# Patient Record
Sex: Female | Born: 1985 | Race: Black or African American | Hispanic: No | Marital: Single | Smoking: Never smoker
Health system: Southern US, Community
[De-identification: ages and names within clinical notes are randomized; demographics above are authoritative.]

---

## 2014-03-24 ENCOUNTER — Emergency Department (HOSPITAL_COMMUNITY): Payer: Self-pay

## 2014-03-24 ENCOUNTER — Emergency Department (HOSPITAL_COMMUNITY)
Admission: EM | Admit: 2014-03-24 | Discharge: 2014-03-24 | Disposition: A | Discharge status: Home / Self Care | Payer: Self-pay | Attending: Emergency Medicine | Admitting: Emergency Medicine

## 2014-03-24 ENCOUNTER — Encounter (HOSPITAL_COMMUNITY): Payer: Self-pay | Admitting: Emergency Medicine

## 2014-03-24 DIAGNOSIS — J069 Acute upper respiratory infection, unspecified: Secondary | ICD-10-CM | POA: Insufficient documentation

## 2014-03-24 MED ORDER — ALBUTEROL SULFATE (2.5 MG/3ML) 0.083% IN NEBU
5.0000 mg | INHALATION_SOLUTION | Freq: Once | RESPIRATORY_TRACT | Status: AC
  Administered 2014-03-24: 5 mg via RESPIRATORY_TRACT
  Filled 2014-03-24: qty 6

## 2014-03-24 MED ORDER — ALBUTEROL SULFATE HFA 108 (90 BASE) MCG/ACT IN AERS
2.0000 | INHALATION_SPRAY | RESPIRATORY_TRACT | Status: DC | PRN
  Filled 2014-03-24: qty 6.7

## 2014-03-24 MED ORDER — SALINE SPRAY 0.65 % NA SOLN: 1.0000 | NASAL | Status: AC | PRN

## 2014-03-24 MED ORDER — GUAIFENESIN 100 MG/5ML PO LIQD: 100.0000 mg | ORAL | Status: AC | PRN

## 2014-03-24 NOTE — ED Provider Notes (Signed)
CSN: [id number]     Arrival date & time 03/24/14  8119 History   First MD Initiated Contact with Patient 03/24/14 1000     Chief Complaint  Patient presents with  . Shortness of Breath     (Consider location/radiation/quality/duration/timing/severity/associated sxs/prior Treatment) HPI  Pt is a 29yo female presenting to ED with intermittent, gradually worsening SOB over the last 5 days, yesterday she developed a wheeze.  Pt states she started having a sore throat with a cough last week, started taking Theraflu but states that is when the wheezing seemed to start. She denies any rash. Denies difficulty swallowing or throat swelling. No known medication allergies.  States she does not have a hx of asthma but did have wheezing previous and was dx with a URI.  She does not have an inhaler at home. Denies smoking. Denies n/v/d. No sick contacts or recent travel. Denies leg pain or swelling. No hx of DVT or PE.   History reviewed. No pertinent past medical history. History reviewed. No pertinent past surgical history. No family history on file. History  Substance Use Topics  . Smoking status: Not on file  . Smokeless tobacco: Not on file  . Alcohol Use: Not on file   OB History    No data available     Review of Systems  Constitutional: Positive for chills. Negative for fever, appetite change and fatigue.  HENT: Positive for congestion and sore throat. Negative for ear pain.   Respiratory: Positive for cough, shortness of breath and wheezing. Negative for stridor.   Cardiovascular: Negative for chest pain, palpitations and leg swelling.  Gastrointestinal: Negative for nausea, vomiting, abdominal pain and diarrhea.  Musculoskeletal: Positive for myalgias. Negative for arthralgias.  All other systems reviewed and are negative.     Allergies  Shrimp  Home Medications   Prior to Admission medications   Medication Sig Start Date End Date Taking? Authorizing Provider  guaiFENesin  (ROBITUSSIN) 100 MG/5ML liquid Take 5-10 mLs (100-200 mg total) by mouth every 4 (four) hours as needed for cough. 03/24/14   Junius Finner, PA-C  sodium chloride (OCEAN) 0.65 % SOLN nasal spray Place 1 spray into both nostrils as needed for congestion. 03/24/14   Junius Finner, PA-C   BP 106/69 mmHg  Pulse 79  Temp(Src) 97.9 F (36.6 C) (Oral)  Resp 20  SpO2 100%  LMP 02/07/2014 Physical Exam  Constitutional: She appears well-developed and well-nourished. No distress.  HENT:  Head: Normocephalic and atraumatic.  Right Ear: Hearing, tympanic membrane, external ear and ear canal normal.  Left Ear: Hearing, tympanic membrane, external ear and ear canal normal.  Nose: Mucosal edema present. Right sinus exhibits no maxillary sinus tenderness and no frontal sinus tenderness. Left sinus exhibits no maxillary sinus tenderness and no frontal sinus tenderness.  Mouth/Throat: Uvula is midline and mucous membranes are normal. Posterior oropharyngeal erythema present. No oropharyngeal exudate, posterior oropharyngeal edema or tonsillar abscesses.  Eyes: Conjunctivae are normal. No scleral icterus.  Neck: Normal range of motion.  Cardiovascular: Normal rate, regular rhythm and normal heart sounds.   Pulmonary/Chest: Effort normal and breath sounds normal. No respiratory distress. She has no wheezes. She has no rales. She exhibits no tenderness.  Exam after pt received albuterol nebulizer tx in triage. Pt speaking in full sentences, no respiratory distress. Lungs: CTAB  Abdominal: Soft. Bowel sounds are normal. She exhibits no distension and no mass. There is no tenderness. There is no rebound and no guarding.  Musculoskeletal: Normal range of  motion.  Neurological: She is alert.  Skin: Skin is warm and dry. She is not diaphoretic.  Nursing note and vitals reviewed.   ED Course  Procedures (including critical care time) Labs Review Labs Reviewed - No data to display  Imaging Review Dg Chest 2  View (if Patient Has Fever And/or Copd)  03/24/2014   CLINICAL DATA:  Short of breath  EXAM: CHEST  2 VIEW  COMPARISON:  None.  FINDINGS: The heart size and mediastinal contours are within normal limits. Both lungs are clear. The visualized skeletal structures are unremarkable.  IMPRESSION: No active cardiopulmonary disease.   Electronically Signed   By: Marlan Palauharles  Clark M.D.   On: 03/24/2014 07:23     EKG Interpretation None      MDM   Final diagnoses:  URI (upper respiratory infection)    Pt is a 28yo female presenting to ED with c/o wheeze, cough and congestion for 4 days. CXR: negative for acute cardiopulmonary disease. Pt afebrile, O2 Sat 100% on RA.    Lungs: CTAB on exam after pt received neb tx in ED. No previous hx of asthma.   Pt stable for discharge home as she states she feels much better after neb tx and feels comfortable going home. Advised to f/u with PCP for recheck of symptoms if not improving. Return precautions provided. Pt verbalized understanding and agreement with tx plan.     Junius Finnerrin O'Malley, PA-C 03/24/14 1700  Tilden FossaElizabeth Rees, MD 03/24/14 1734

## 2014-03-24 NOTE — ED Notes (Signed)
NAD at this time. Pt is stable and leaving with a friend. 

## 2014-03-24 NOTE — Discharge Instructions (Signed)
Cool Mist Vaporizers °Vaporizers may help relieve the symptoms of a cough and cold. They add moisture to the air, which helps mucus to become thinner and less sticky. This makes it easier to breathe and cough up secretions. Cool mist vaporizers do not cause serious burns like hot mist vaporizers, which may also be called steamers or humidifiers. Vaporizers have not been proven to help with colds. You should not use a vaporizer if you are allergic to mold. °HOME CARE INSTRUCTIONS °· Follow the package instructions for the vaporizer. °· Do not use anything other than distilled water in the vaporizer. °· Do not run the vaporizer all of the time. This can cause mold or bacteria to grow in the vaporizer. °· Clean the vaporizer after each time it is used. °· Clean and dry the vaporizer well before storing it. °· Stop using the vaporizer if worsening respiratory symptoms develop. °Document Released: 09/23/2003 Document Revised: 12/31/2012 Document Reviewed: 05/15/2012 °ExitCare® Patient Information ©2015 [employer]. This information is not intended to replace advice given to you by your health care provider. Make sure you discuss any questions you have with your health care provider. ° °

## 2014-03-24 NOTE — ED Notes (Signed)
Pt report she has been having progressively worsening SOB since last Thursday. Reports yesterday she started wheezing. Patient able to speak in complete sentences in triage, SPO2 100%, Ax4, NAD.

## 2014-10-04 ENCOUNTER — Emergency Department (HOSPITAL_COMMUNITY)
Admission: EM | Admit: 2014-10-04 | Discharge: 2014-10-04 | Disposition: A | Discharge status: Home / Self Care | Payer: Managed Care, Other (non HMO) | Attending: Emergency Medicine | Admitting: Emergency Medicine

## 2014-10-04 ENCOUNTER — Encounter (HOSPITAL_COMMUNITY): Payer: Self-pay | Admitting: Nurse Practitioner

## 2014-10-04 ENCOUNTER — Emergency Department (HOSPITAL_COMMUNITY): Payer: Managed Care, Other (non HMO)

## 2014-10-04 DIAGNOSIS — Y998 Other external cause status: Secondary | ICD-10-CM | POA: Insufficient documentation

## 2014-10-04 DIAGNOSIS — S6991XA Unspecified injury of right wrist, hand and finger(s), initial encounter: Secondary | ICD-10-CM | POA: Diagnosis present

## 2014-10-04 DIAGNOSIS — X58XXXA Exposure to other specified factors, initial encounter: Secondary | ICD-10-CM | POA: Insufficient documentation

## 2014-10-04 DIAGNOSIS — Y9383 Activity, rough housing and horseplay: Secondary | ICD-10-CM | POA: Insufficient documentation

## 2014-10-04 DIAGNOSIS — S63501A Unspecified sprain of right wrist, initial encounter: Secondary | ICD-10-CM | POA: Diagnosis not present

## 2014-10-04 DIAGNOSIS — Y9289 Other specified places as the place of occurrence of the external cause: Secondary | ICD-10-CM | POA: Insufficient documentation

## 2014-10-04 MED ORDER — KETOROLAC TROMETHAMINE 60 MG/2ML IM SOLN
60.0000 mg | Freq: Once | INTRAMUSCULAR | Status: AC
  Administered 2014-10-04: 60 mg via INTRAMUSCULAR
  Filled 2014-10-04: qty 2

## 2014-10-04 MED ORDER — NAPROXEN 500 MG PO TABS: 500.0000 mg | ORAL_TABLET | Freq: Two times a day (BID) | ORAL | Status: AC

## 2014-10-04 NOTE — ED Notes (Signed)
She c/o R wrist pain since she was horseplaying with a friend on Friday night. Cms intact. She tried advil at home with minimal relief

## 2014-10-04 NOTE — Discharge Instructions (Signed)
Wrist Pain Follow up with [facility]. Return for numbness or tingling in the hand. Wear wrist splint until you no longer have pain. A wrist sprain happens when the bands of tissue that hold the wrist joints together (ligament) stretch too much or tear. A wrist strain happens when muscles or bands of tissue that connect muscles to bones (tendons) are stretched or pulled. HOME CARE  Put ice on the injured area.  Put ice in a plastic bag.  Place a towel between your skin and the bag.  Leave the ice on for 15-20 minutes, 03-04 times a day, for the first 2 days.  Raise (elevate) the injured wrist to lessen puffiness (swelling).  Rest the injured wrist for at least 48 hours or as told by your doctor.  Wear a splint, cast, or an elastic wrap as told by your doctor.  Only take medicine as told by your doctor.  Follow up with your doctor as told. This is important. GET HELP RIGHT AWAY IF:   The fingers are puffy, very red, white, or cold and blue.  The fingers lose feeling (numb) or tingle.  The pain gets worse.  It is hard to move the fingers. MAKE SURE YOU:   Understand these instructions.  Will watch your condition.  Will get help right away if you are not doing well or get worse. Document Released: 06/14/2007 Document Revised: 03/20/2011 Document Reviewed: 02/16/2010 [facility] Patient Information 2015 [place], [place]. This information is not intended to replace advice given to you by your health care provider. Make sure you discuss any questions you have with your health care provider.  Emergency Department Resource Guide 1) Find a Doctor and Pay Out of Pocket Although you won't have to find out who is covered by your insurance plan, it is a good idea to ask around and get recommendations. You will then need to call the office and see if the doctor you have chosen will accept you as a new patient and what types of options they offer for patients who are self-pay.  Some doctors offer discounts or will set up payment plans for their patients who do not have insurance, but you will need to ask so you aren't surprised when you get to your appointment.  2) Contact Your Local Health Department Not all health departments have doctors that can see patients for sick visits, but many do, so it is worth a call to see if yours does. If you don't know where your local health department is, you can check in your phone book. The CDC also has a tool to help you locate your state's health department, and many state websites also have listings of all of their local health departments.  3) Find a Walk-in Clinic If your illness is not likely to be very severe or complicated, you may want to try a walk in clinic. These are popping up all over the country in pharmacies, drugstores, and shopping centers. They're usually staffed by nurse practitioners or physician assistants that have been trained to treat common illnesses and complaints. They're usually fairly quick and inexpensive. However, if you have serious medical issues or chronic medical problems, these are probably not your best option.  No Primary Care Doctor: - Call Health Connect at  [phone] - they can help you locate a primary care doctor that  accepts your insurance, provides certain services, etc. - Physician Referral Service- [phone]  Chronic Pain Problems: Organization  Address  Phone   Notes  [facility]  [phone] Patients need to be referred by their primary care doctor.   Medication Assistance: Organization         Address  Phone   Notes  [facility] Medication [facility] [address] [place], [place] [address] [phone] --Must be a resident of [facility] -- Must have NO insurance coverage whatsoever (no Medicaid/ Medicare, etc.) -- The pt. MUST have a primary care doctor that directs their care regularly and follows them in the  community   MedAssist  [phone]   [employer]  [phone]    Agencies that provide inexpensive medical care: Organization         Address  Phone   Notes  [place]  [phone]   [facility]    [phone]   [facility] [place], [place] [address] [phone]   [place] [address], [place] [phone]   Planned Parenthood    [phone]   [facility]    [phone]   [place] and [address], [place] Phone:  [phone], Fax:  [phone] Hours of Operation:  9 am - 6 pm, M-F.  Also accepts Medicaid/Medicare and self-pay.  [facility] for [place], [address], [place] Phone: [phone], Fax: [phone]. Hours of Operation:  8:30 am - 5:30 pm, M-F.  Also accepts Medicaid and self-pay.  [facility] [place] [address] [place] Phone: [phone]   [place], [place], [place] [phone], Ext. [phone] Mondays & Thursdays: 7-9 AM.  First 15 patients are seen on a first come, first serve basis.    [place] Providers:  Organization         Address  Phone   Notes  [facility] [address], [place] [phone] Also accepts self-pay patients.  [facility] [address], [place]  [phone]   [place], [address], [place] [phone]   [facility] [address], [place] [phone]   Lucianne Lei [address], [place]   [phone] Only accepts [place] Access [place] patients after they have their name applied to their card.   Self-Pay (no insurance) in [place] [facility]:  Organization         Address  Phone   Notes  Sickle Cell Patients, [facility]  Internal Medicine [place] [phone]   [facility] Urgent Care [place] [phone]   [facility] [place]  [place], [place], [place] [phone]   [facility]/Dr. Osei-Bonsu  [address], [place] or [address], [place] [phone] Phone number for both [place] and [place] locations is the same.  Urgent Medical and [facility] [address] [place] [phone]   [facility] [address] [place] or [address] [phone]   [facility] [address] [place] [phone], phone; [phone], fax Sees patients 1st and 3rd Saturday of every month.  Must not qualify  for public or private insurance (i.e. Medicaid, Medicare, [place] Health Choice, Veterans' Benefits)  Household income should be no more than 200% of the poverty level The clinic cannot treat you if you are pregnant or think you are pregnant  Sexually transmitted diseases are not treated at the clinic.    Dental Care: Organization         Address  Phone  Notes  [facility] [place] [phone] Accepts children up to age 69 who are enrolled in [place] or [place]; pregnant women with a Medicaid card; and children who have applied for Medicaid or [place] Health Choice, but were declined, whose parents can pay a reduced fee at time of service.  [facility] Department of [facility]  [address], [place] [phone] Accepts children up to age 63 who are enrolled in [place] or [place]; pregnant women with a Medicaid card; and children who have applied for Medicaid or [place] Health Choice, but were declined, whose parents can pay a reduced fee at time of service.  [place] Adult Dental Access PROGRAM  [place] [phone] Patients are seen by appointment only. Walk-ins are not accepted. [place] will see patients 2 years of age and older. Monday - Tuesday (8am-5pm) Most Wednesdays (8:30-5pm) $30 per visit, cash only  [facility] Adult Dental Access PROGRAM  [address], [facility] [phone] Patients are seen by appointment only. Walk-ins are not accepted. [place] will see patients 63 years of age and older. One Wednesday Evening (Monthly: Volunteer Based).  $30 per visit, cash only  [place]  [phone] for adults; Children under age 59, call Graduate Pediatric Dentistry at [phone]. Children aged 63-14, please call [phone] to request a pediatric application.  Dental services are provided in all areas of dental care including fillings, crowns and bridges, complete and partial dentures, implants, gum treatment, root canals, and extractions. Preventive care is also provided. Treatment is provided to both adults and children. Patients are selected via a lottery and there is often a waiting list.   [facility] [address] [place]  [phone] [email or web address]   [facility] [address] [place], [place] [phone], Ext. [phone] Second and Fourth Thursday of each month, opens at 6:30 AM; Clinic ends at 9 AM.  Patients are seen on a first-come first-served basis, and a limited number are seen during each clinic.   [facility]  [address] Hillard Danker [place], [place] [phone]   Eligibility Requirements You must have lived in [place], [place], or [place] counties for at least the last three months.   You cannot be eligible for state or federal sponsored [employer], including [employer], [place], or [employer].   You generally cannot be eligible for healthcare insurance through your employer.    How to apply: Eligibility screenings are held every  Tuesday and Wednesday afternoon from 1:00 pm until 4:00 pm. You do not need an appointment for the interview!  [facility] [address], [place], [place]   [place]  [place] Department  [place]  [phone]    Behavioral Health Resources in the Community: Intensive Outpatient Programs Organization         Address  Phone  Notes  [facility] [address] [place], [place] [phone]   [facility] Outpatient [address] [place], [place]   ADS: Alcohol & Drug Svcs [address], [place], [place]   [place] [address]  [place], [place] or [phone]   Substance Abuse Resources Organization         Address  Phone  Notes  Alcohol and Drug Services  [phone]   [place]  [phone]   [facility] [place]   Chinita Pester  [phone]   Residential & Outpatient Substance Abuse Program  [phone]   Psychological Services Organization         Address  Phone  Notes  [facility]  [place]  [phone]   [place] [address], [place] or [phone]    Mobile Crisis Teams Organization         Address  Phone  Notes  [facility], Mobile Crisis Care Unit  [phone]   [facility]  [address] [place], [place]   [place] [address] [place] [phone]    Self-Help/Support Groups Organization         Address  Phone             Notes  [place] of [place] - variety of support groups  [place] Call for more information  Narcotics Anonymous (NA), [facility] [address], [employer] [place]  2 meetings at this location    Special educational needs teacher         Address  Phone  Notes  ASAP Residential Treatment [place],    [place]  [phone]   [facility]  [address], [place] [phone], [place], [place]   [place], [place] [phone] Admissions: 8am-3pm M-F  Incentives Substance [place] [address]    [place], [place] [phone]   [facility] [address] [place], [place], [place]   [facility] [address],  [place], [place]   [facility]ight Programs - Intensive Outpatient [address] Kristeen [place] [address], [place], [place]   [facility] ([place].,  [place], [place] [phone] or [phone]   [facility]idential Treatment Services (RTS) [address] [place], [place] Accepts Medicaid  [facility] [place] [address],  [place] [phone] Substance Abuse/Addiction Treatment   [facility] Organization         Address  Phone  Notes  [facility]  [phone]   Domenic Schwab, PhD [address] Arlis Porta [place], [place]   [phone] or [phone]   [place]   [address] [place], [place] [phone]   [facility] [address], [place], [place] [phone] Insurance/Medicaid/sponsorship through [facility] and Families [address], [place]                                    [place], [place] [phone] [place] [address], [place] [phone]    Dr. Adele Schilder  [phone]   [facility] [place]  [facility]. 1) [address] [place] 2) [place] 3)  [place] [address], [place] [phone]  [phone]   [place] [phone] or [phone] (After  Hours)

## 2014-10-04 NOTE — ED Notes (Signed)
Declined W/C at D/C and was escorted to lobby by RN. 

## 2014-10-04 NOTE — ED Provider Notes (Signed)
CSN: [id number]     Arrival date & time 10/04/14  1201 History  This chart was scribed for Catha Gosselin, PA-C, working with Marily Memos, MD by Octavia Heir, ED Scribe. This patient was seen in room TR09C/TR09C and the patient's care was started at 1:38 PM.     Chief Complaint  Patient presents with  . Wrist Pain      The history is provided by the patient. No language interpreter was used.   HPI Comments: Lauren Hurley is a 29 y.o. female who presents to the Emergency Department complaining of a sudden onset, gradual worsening right wrist pain onset 3 days ago. She reports that she was horse playing with a friend on Friday and states that could be the possible cause of her pain. Pt says it is difficult to flex or extend her wrist without having pain. She notes taking Advil to alleviate the pain with no relief. Pt denies any medical conditions and surgeries on her right wrist.  History reviewed. No pertinent past medical history. History reviewed. No pertinent past surgical history. History reviewed. No pertinent family history. Social History  Substance Use Topics  . Smoking status: Never Smoker   . Smokeless tobacco: None  . Alcohol Use: No   OB History    No data available     Review of Systems  Musculoskeletal: Positive for arthralgias.  Neurological: Negative for numbness.  All other systems reviewed and are negative.     Allergies  Shrimp  Home Medications   Prior to Admission medications   Medication Sig Start Date End Date Taking? Authorizing Provider  guaiFENesin (ROBITUSSIN) 100 MG/5ML liquid Take 5-10 mLs (100-200 mg total) by mouth every 4 (four) hours as needed for cough. 03/24/14   Junius Finner, PA-C  naproxen (NAPROSYN) 500 MG tablet Take 1 tablet (500 mg total) by mouth 2 (two) times daily. 10/04/14   Hanna Patel-Mills, PA-C  sodium chloride (OCEAN) 0.65 % SOLN nasal spray Place 1 spray into both nostrils as needed for congestion. 03/24/14   Junius Finner, PA-C   Triage vitals: BP 117/70 mmHg  Pulse 73  Temp(Src) 98.3 F (36.8 C) (Oral)  Resp 16  Ht  (1.753 m)  Wt 192 lb 8 oz (87.317 kg)  BMI 28.41 kg/m2  SpO2 97%  LMP 09/13/2014 Physical Exam  Constitutional: She appears well-developed and well-nourished. No distress.  HENT:  Head: Normocephalic and atraumatic.  Eyes: Right eye exhibits no discharge. Left eye exhibits no discharge.  Pulmonary/Chest: Effort normal. No respiratory distress.  Musculoskeletal:  Tenderness with flexion and extension of the wrist but able to perform the motions. 2+ radial pulse, NVI, she has 4/5 grip strength. No deformity noted. Less than 2 second capillary refill. No snuffbox tenderness.  Neurological: She is alert. Coordination normal.  Skin: No rash noted. She is not diaphoretic.  Psychiatric: She has a normal mood and affect. Her behavior is normal.  Nursing note and vitals reviewed.   ED Course  Procedures  DIAGNOSTIC STUDIES: Oxygen Saturation is 97% on RA, normal by my interpretation.  COORDINATION OF CARE:  1:42 PM Discussed treatment plan with pt at bedside and pt agreed to plan.  Labs Review Labs Reviewed - No data to display  Imaging Review Dg Wrist Complete Right  10/04/2014   CLINICAL DATA:  Right wrist pain. Swelling. Possible injury 10/03/2014.  EXAM: RIGHT WRIST - COMPLETE 3+ VIEW  COMPARISON:  None.  FINDINGS: There is no evidence of fracture or dislocation. There  is no evidence of arthropathy or other focal bone abnormality. Soft tissues are unremarkable.  IMPRESSION: Negative exam.   Electronically Signed   By: Drusilla Kanner M.D.   On: 10/04/2014 14:09     EKG Interpretation None      MDM   Final diagnoses:  Wrist sprain, right, initial encounter  Patient presents for right wrist pain after horsing around with her friend 2 days ago. She is neurovascularly intact. X-ray of the right wrist shows no acute fracture or dislocation. She was given a wrist  splint. I discussed return precautions with the patient as well as follow-up and she verbally agrees with the plan.  Rx: Naproxen Medications  ketorolac (TORADOL) injection 60 mg (60 mg Intramuscular Given 10/04/14 1432)   Filed Vitals:   10/04/14 1244  BP: 117/70  Pulse: 73  Temp: 98.3 F (36.8 C)  Resp: 16   I personally performed the services described in this documentation, which was scribed in my presence. The recorded information has been reviewed and is accurate.   Catha Gosselin, PA-C 10/04/14 1434  Marily Memos, MD 10/05/14 1553

## 2017-04-16 IMAGING — CR DG WRIST COMPLETE 3+V*R*
4 series · 4 of 4 positions shown · non-contrast
Comparison: None.

CLINICAL DATA: Right wrist pain. Swelling. Possible injury
10/03/2014.

EXAM:
RIGHT WRIST - COMPLETE 3+ VIEW

[wrist pa]
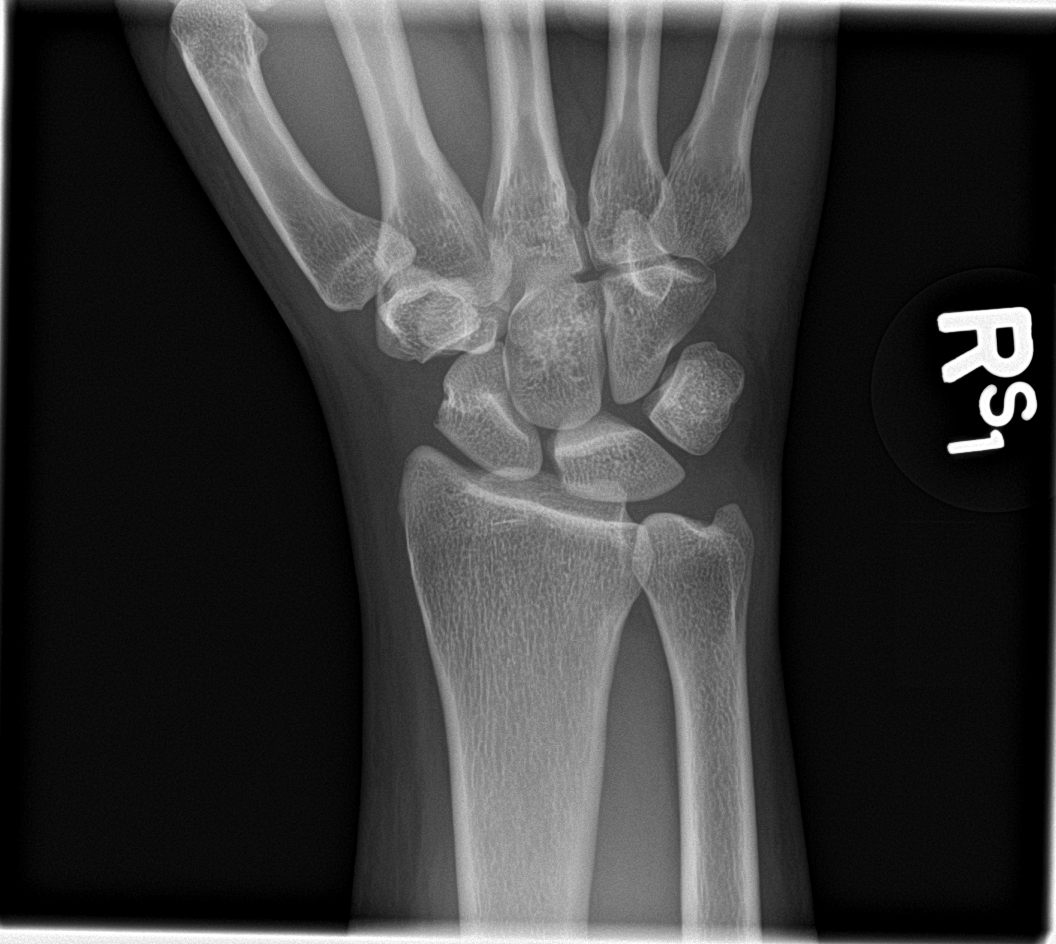

[wrist obl]
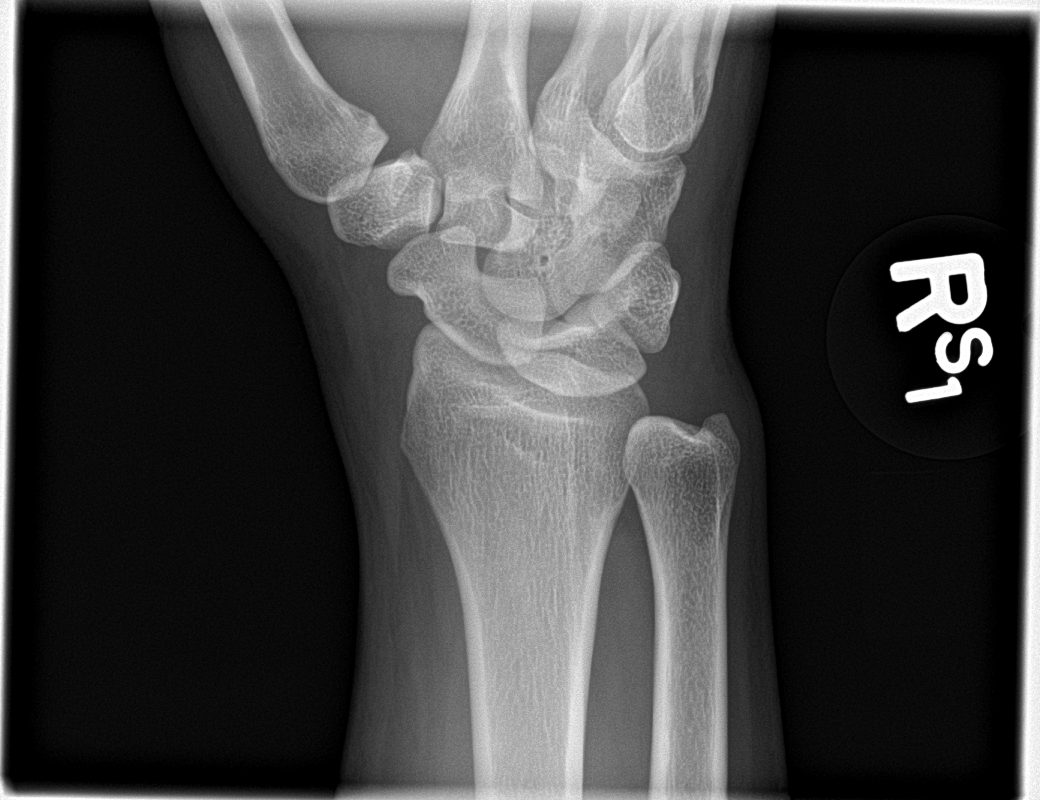

[wrist lat]
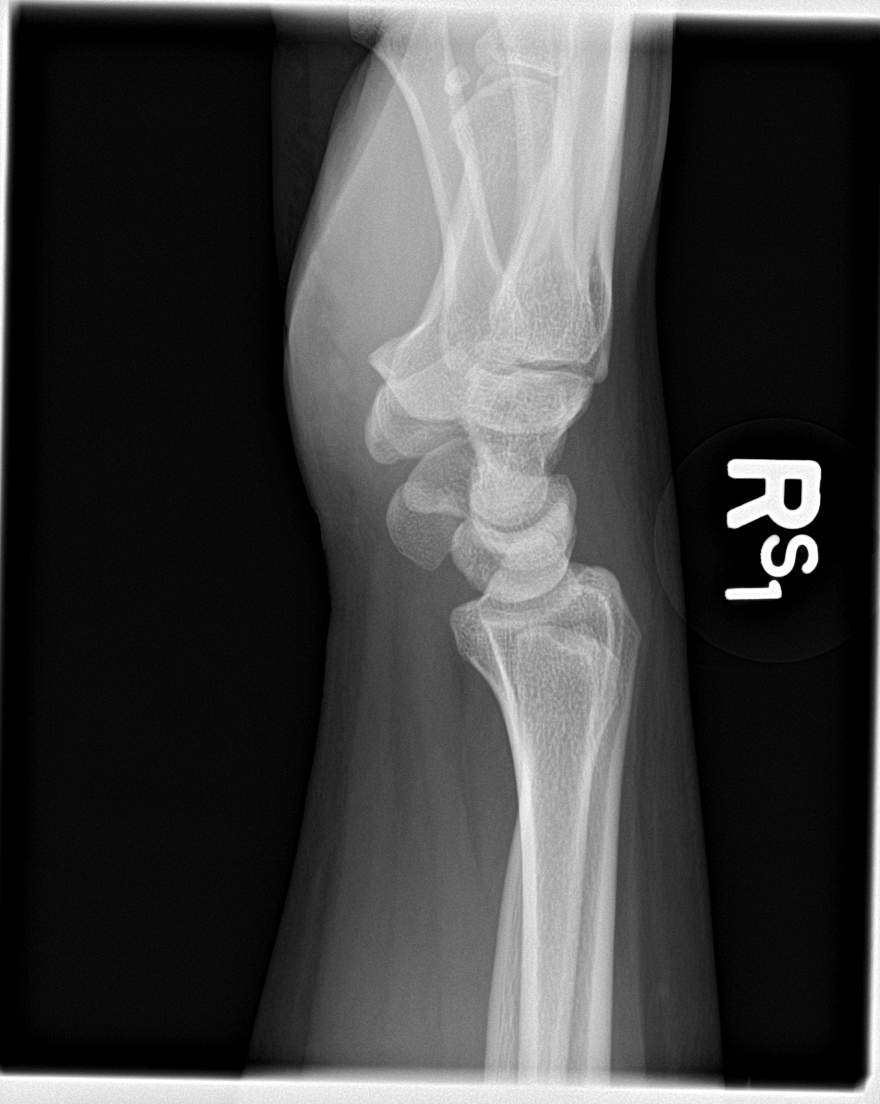

[wrist navicular]
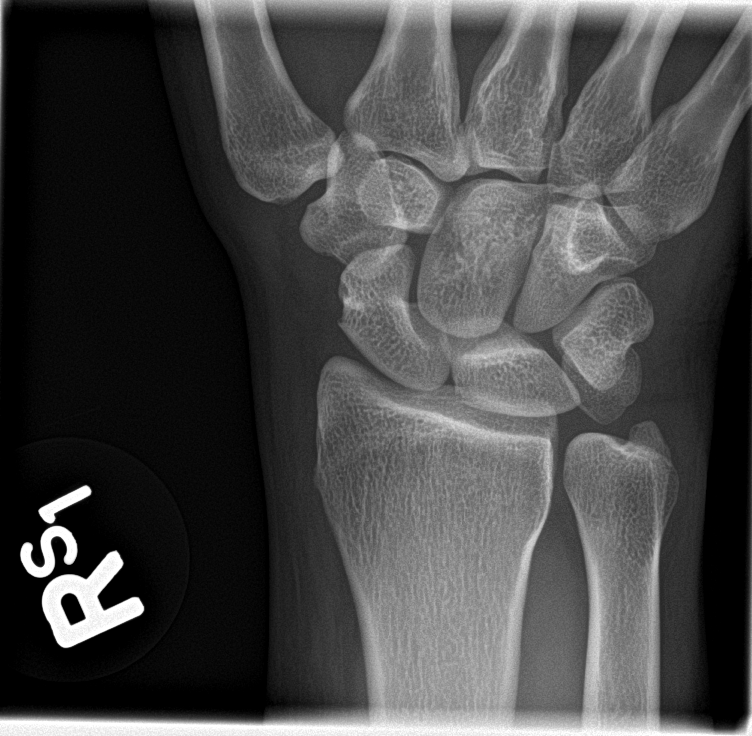

[4 of 4 positions shown; findings below may reference images not displayed]

FINDINGS: There is no evidence of fracture or dislocation. There is no
evidence of arthropathy or other focal bone abnormality. Soft
tissues are unremarkable.
IMPRESSION: Negative exam.
